# Patient Record
Sex: Male | Born: 1955 | Hispanic: Yes | Smoking: Never smoker
Health system: Southern US, Community
[De-identification: ages and names within clinical notes are randomized; demographics above are authoritative.]

---

## 2016-07-06 ENCOUNTER — Emergency Department
Admission: EM | Admit: 2016-07-06 | Discharge: 2016-07-06 | Disposition: A | Discharge status: Home / Self Care | Payer: No Typology Code available for payment source | Attending: Emergency Medicine | Admitting: Emergency Medicine

## 2016-07-06 ENCOUNTER — Encounter: Payer: Self-pay | Admitting: Emergency Medicine

## 2016-07-06 ENCOUNTER — Emergency Department: Payer: No Typology Code available for payment source

## 2016-07-06 DIAGNOSIS — Y999 Unspecified external cause status: Secondary | ICD-10-CM | POA: Diagnosis not present

## 2016-07-06 DIAGNOSIS — M1990 Unspecified osteoarthritis, unspecified site: Secondary | ICD-10-CM

## 2016-07-06 DIAGNOSIS — Y92410 Unspecified street and highway as the place of occurrence of the external cause: Secondary | ICD-10-CM | POA: Insufficient documentation

## 2016-07-06 DIAGNOSIS — Y9389 Activity, other specified: Secondary | ICD-10-CM | POA: Diagnosis not present

## 2016-07-06 DIAGNOSIS — M19012 Primary osteoarthritis, left shoulder: Secondary | ICD-10-CM | POA: Insufficient documentation

## 2016-07-06 DIAGNOSIS — R0781 Pleurodynia: Secondary | ICD-10-CM | POA: Diagnosis not present

## 2016-07-06 DIAGNOSIS — S4992XA Unspecified injury of left shoulder and upper arm, initial encounter: Secondary | ICD-10-CM | POA: Diagnosis present

## 2016-07-06 DIAGNOSIS — M546 Pain in thoracic spine: Secondary | ICD-10-CM | POA: Insufficient documentation

## 2016-07-06 MED ORDER — MELOXICAM 7.5 MG PO TABS: 7.5000 mg | ORAL_TABLET | Freq: Every day | ORAL | 2 refills | Status: AC

## 2016-07-06 MED ORDER — IBUPROFEN 600 MG PO TABS
600.0000 mg | ORAL_TABLET | Freq: Once | ORAL | Status: AC
  Administered 2016-07-06: 600 mg via ORAL
  Filled 2016-07-06: qty 1
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
## 2017-10-05 ENCOUNTER — Ambulatory Visit
Admission: RE | Admit: 2017-10-05 | Discharge: 2017-10-05 | Disposition: A | Discharge status: Home / Self Care | Payer: Self-pay | Source: Ambulatory Visit | Attending: Physician Assistant | Admitting: Physician Assistant

## 2017-10-05 ENCOUNTER — Other Ambulatory Visit: Payer: Self-pay | Admitting: Physician Assistant

## 2017-10-05 DIAGNOSIS — K4090 Unilateral inguinal hernia, without obstruction or gangrene, not specified as recurrent: Secondary | ICD-10-CM

## 2018-10-09 IMAGING — US US PELVIS LIMITED
1 series · 10 of 10 positions shown · non-contrast
Comparison: None.

CLINICAL DATA: Right groin and suprapubic palpable lump x2 months.

EXAM:
LIMITED ULTRASOUND OF PELVIS
TECHNIQUE: Limited transabdominal ultrasound examination of the pelvis was
performed.

[Series 1: us pelvis limited · 10 acquisitions, 10 frames shown]
[im 1/10]
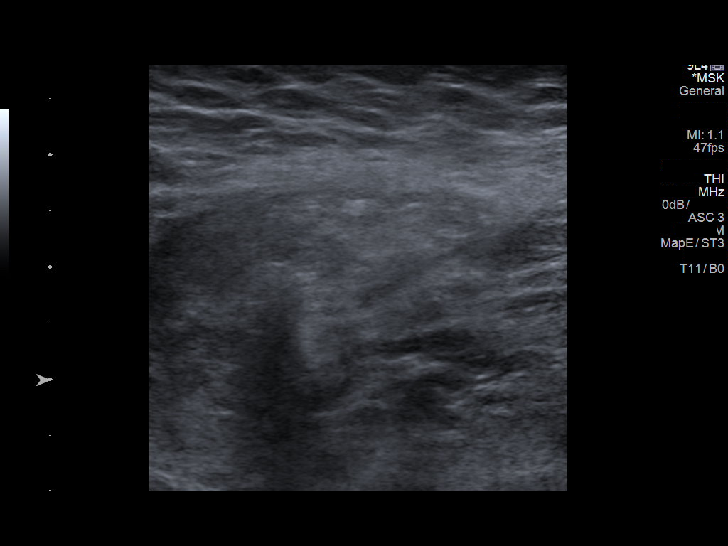
[im 2/10]
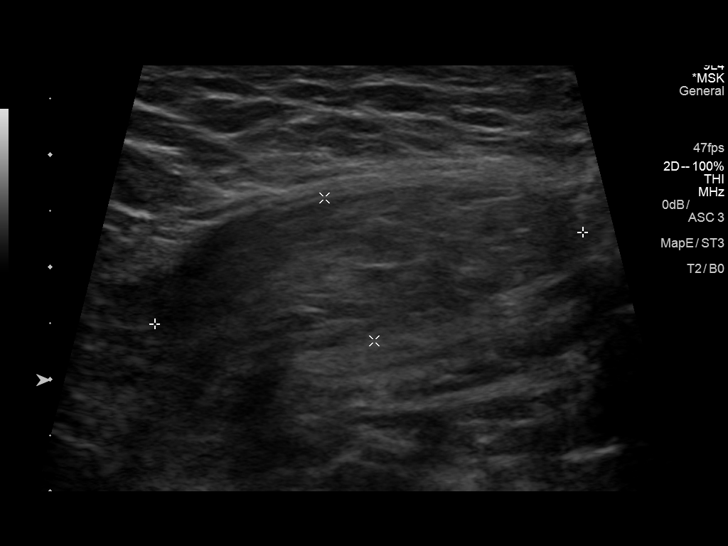
[im 3/10]
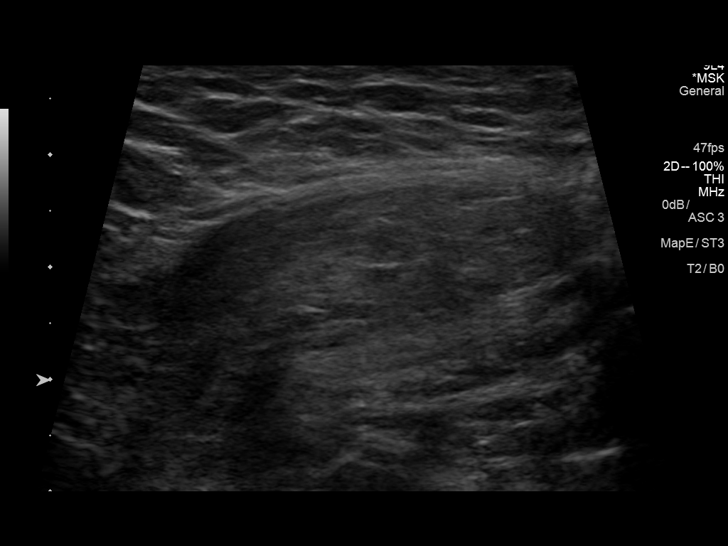
[im 4/10]
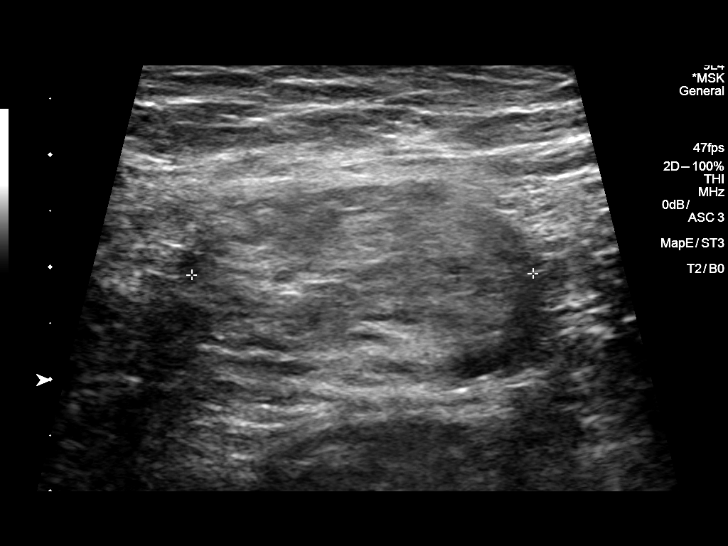
[im 5/10]
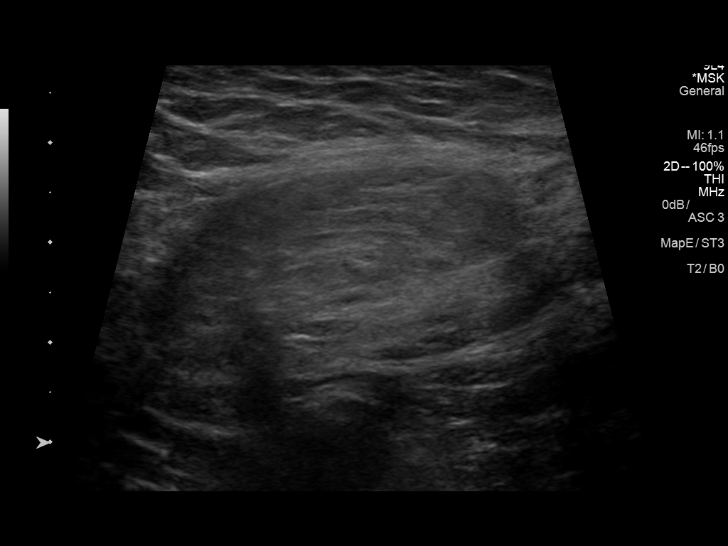
[im 6/10]
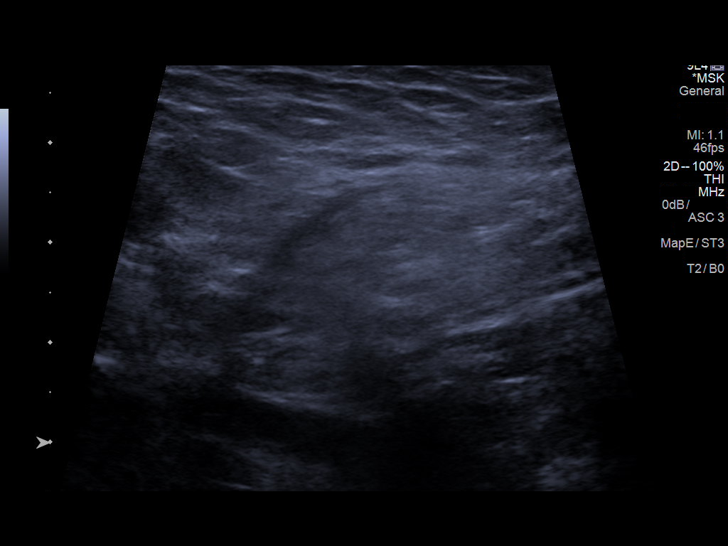
[im 7/10]
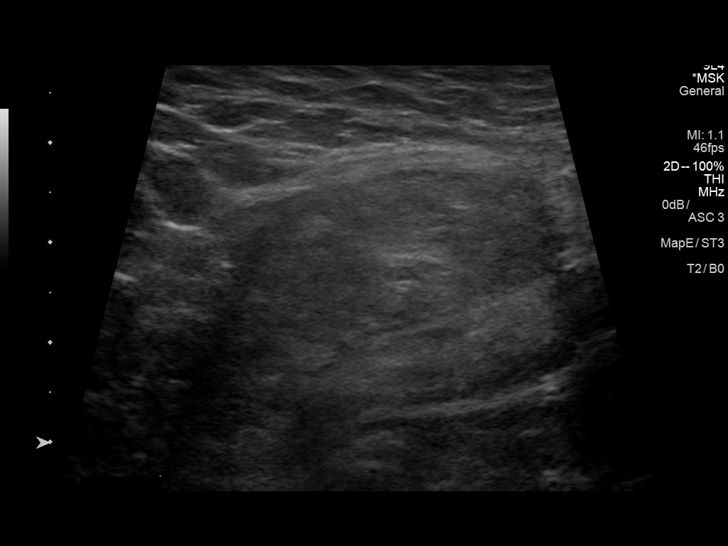
[im 8/10]
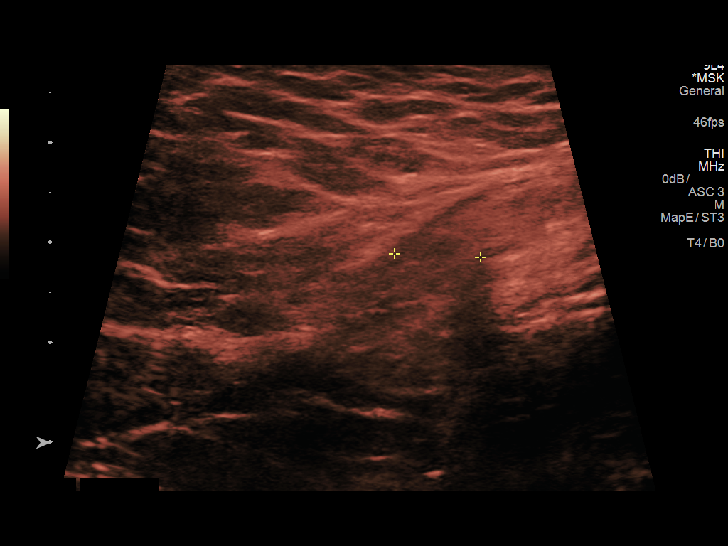
[im 9/10]
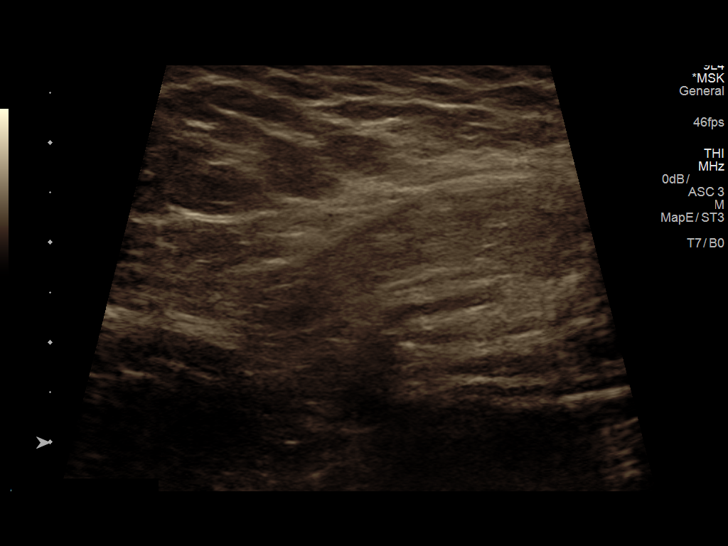
[im 10/10]
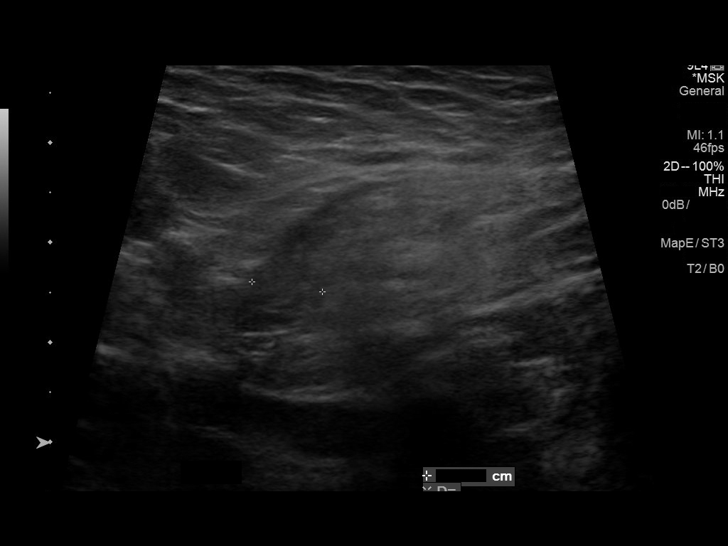

[10 of 10 positions shown; findings below may reference images not displayed]

FINDINGS: Imaging over the area in question was performed over the right
groin. Herniation of a solid 3.9 x 1.4 x 3 cm echogenic masslike
abnormality in the right groin likely representing herniation of
properitoneal fat is noted with the neck measuring 0.9 cm. No bowel
signature is identified within to suggest herniating bowel. This
area increased in prominence upon sitting and retracted with
pressure from the ultrasound probe. No fluid collection is seen.
This does not have the appearance of an enlarged lymph node.
IMPRESSION: Echogenic 3.9 x 1.4 x 3 cm masslike abnormality which retracts upon
compression with the sonographic probe. Findings likely represent
herniation of properitoneal fat, more commonly associated with
inguinal canal.

## 2019-09-28 ENCOUNTER — Ambulatory Visit: Payer: Self-pay | Attending: Internal Medicine

## 2019-09-28 DIAGNOSIS — Z23 Encounter for immunization: Secondary | ICD-10-CM

## 2019-09-28 NOTE — Progress Notes (Signed)
   Covid-19 Vaccination Clinic  Name:  KYREE ADRIANO    MRN: [id number] DOB: Mar 11, 1956  09/28/2019  Mr. Arora was observed post Covid-19 immunization for 15 minutes without incident. He was provided with Vaccine Information Sheet and instruction to access the V-Safe system.   Mr. Nuttall was instructed to call 911 with any severe reactions post vaccine: Marland Kitchen Difficulty breathing  . Swelling of face and throat  . A fast heartbeat  . A bad rash all over body  . Dizziness and weakness   Immunizations Administered    Name Date Dose VIS Date Route   Pfizer COVID-19 Vaccine 09/28/2019  6:39 PM 0.3 mL 06/13/2019 Intramuscular   Manufacturer: [employer], [employer]   Lot: [id number]   NDC: [address]-5

## 2019-10-19 ENCOUNTER — Ambulatory Visit: Payer: Self-pay | Attending: Internal Medicine

## 2019-10-19 DIAGNOSIS — Z23 Encounter for immunization: Secondary | ICD-10-CM

## 2019-10-19 NOTE — Progress Notes (Signed)
   Covid-19 Vaccination Clinic  Name:  Darren Esparza    MRN: [id number] DOB: 12/31/1955  10/19/2019  Darren Esparza was observed post Covid-19 immunization for 15 minutes without incident. He was provided with Vaccine Information Sheet and instruction to access the V-Safe system.   Darren Esparza was instructed to call 911 with any severe reactions post vaccine: Marland Kitchen Difficulty breathing  . Swelling of face and throat  . A fast heartbeat  . A bad rash all over body  . Dizziness and weakness   Immunizations Administered    Name Date Dose VIS Date Route   Pfizer COVID-19 Vaccine 10/19/2019  5:34 PM 0.3 mL 06/13/2019 Intramuscular   Manufacturer: [employer], [employer]   Lot: [id number]   NDC: [address]-6
